# Patient Record
Sex: Male | Born: 1955 | Race: Black or African American | Hispanic: No | Marital: Single
Health system: Southern US, Community
[De-identification: ages and names within clinical notes are randomized; demographics above are authoritative.]

## PROBLEM LIST (undated history)

## (undated) DIAGNOSIS — N179 Acute kidney failure, unspecified: Secondary | ICD-10-CM

## (undated) DIAGNOSIS — J1282 Pneumonia due to coronavirus disease 2019: Secondary | ICD-10-CM

## (undated) DIAGNOSIS — J9621 Acute and chronic respiratory failure with hypoxia: Secondary | ICD-10-CM

## (undated) DIAGNOSIS — B20 Human immunodeficiency virus [HIV] disease: Secondary | ICD-10-CM

## (undated) DIAGNOSIS — U071 COVID-19: Secondary | ICD-10-CM

## (undated) DIAGNOSIS — Z21 Asymptomatic human immunodeficiency virus [HIV] infection status: Secondary | ICD-10-CM

## (undated) DIAGNOSIS — N189 Chronic kidney disease, unspecified: Secondary | ICD-10-CM

---

## 2007-03-23 ENCOUNTER — Ambulatory Visit (HOSPITAL_COMMUNITY): Admission: RE | Admit: 2007-03-23 | Discharge: 2007-03-23 | Payer: Self-pay | Admitting: Urology

## 2007-05-09 ENCOUNTER — Encounter (INDEPENDENT_AMBULATORY_CARE_PROVIDER_SITE_OTHER): Payer: Self-pay | Admitting: Urology

## 2007-05-09 ENCOUNTER — Inpatient Hospital Stay (HOSPITAL_COMMUNITY): Admission: RE | Admit: 2007-05-09 | Discharge: 2007-05-11 | Payer: Self-pay | Admitting: Urology

## 2010-06-15 ENCOUNTER — Encounter: Payer: Self-pay | Admitting: Urology

## 2010-10-07 NOTE — Op Note (Signed)
NAMEHOWIE, Connor Barr            ACCOUNT NO.:  [id number]   MEDICAL RECORD NO.:  [id number]          PATIENT TYPE:  INP   LOCATION:  1416                         FACILITY:  [facility]   PHYSICIAN:  Heloise Purpura, MD      DATE OF BIRTH:  [birth year]   DATE OF PROCEDURE:  05/09/2007  DATE OF DISCHARGE:                               OPERATIVE REPORT   PREOPERATIVE DIAGNOSIS:  Clinically localized adenocarcinoma of  prostate.   POSTOPERATIVE DIAGNOSIS:  Clinically localized adenocarcinoma of  prostate.   PROCEDURES.:  1. Robotic-assisted laparoscopic radical prostatectomy (non nerve      sparing).  2. Bilateral laparoscopic pelvic lymphadenectomy.   SURGEON:  Heloise Purpura, MD   ASSISTANT:  Dr. Judeen Hammans.   ESTIMATED BLOOD LOSS:  100 mL.   SPECIMENS:  1. Prostate and seminal vesicles.  2. Right pelvic lymph nodes.  3. Left pelvic lymph node.   DISPOSITION OF SPECIMEN:  To pathology.   DRAINS:  1. A 20-French Cuda catheter.  2. A #19 Blake pelvic drain.   INDICATION:  Mr. Esco is a 55 year old gentleman with clinically  localized adenocarcinoma of the prostate.  After discussing management  options for treatment, he elected to proceed with surgical therapy and  the above procedures.  The potential risks, complications and  alternative options were discussed with the patient in detail, and  informed consent was obtained..   DESCRIPTION OF PROCEDURE:  The patient was taken to the operating room  and a general anesthetic was administered.  He was given preoperative  antibiotics, placed in the dorsal lithotomy position, and prepped and  draped in the usual sterile fashion.  Next, a preoperative time-out was  performed.  A Foley catheter was inserted into the bladder, and a site  was selected just superior to the umbilicus for placement of the camera  port.  This was placed using a standard open Hassan technique.  This  allowed entry into the peritoneal cavity under  direct vision without  difficulty.  A 0-degree lens was used to inspect the abdomen, and there  was no evidence of any intra-abdominal injuries or other abnormalities.  The remaining ports were then placed.  Bilateral 8 mm robotic ports were  placed approximately 10 cm lateral to and just inferior to the camera  port incision.  An additional 8 mm robotic port was placed in the far  left lateral abdominal wall.  A 5 mm port was placed between the camera  port and the right robotic port.  An additional 12 mm port was placed in  the far right lateral abdominal wall for laparoscopic assistance.  All  ports were placed under direct vision and without difficulty.  The  surgical cart was then docked.  With the aid of the cautery scissors,  the bladder was reflected posteriorly, allowing entry into the space of  Retzius and identification of the endopelvic fascia and prostate.  The  endopelvic fascia was then incised from the apex back to the base of the  prostate bilaterally, and the underlying levator muscle fibers were  swept laterally off the prostate; thereby isolating the  dorsal venous  complex.  The dorsal venous complex was then stapled and divided with a  45 mm flex ETS stapler.  The bladder neck was then identified with the  aid of Foley catheter manipulation, and the bladder neck was incised  anteriorly.  This exposed the Foley catheter, and the catheter balloon  was deflated and brought into the operative field to retract the  prostate anteriorly.  The posterior bladder neck was then identified and  was similarly divided.  Dissection continued between the bladder neck  and the prostate until the vas deferens and seminal vesicles were  identified.  The vas deferens were isolated, divided and lifted  anteriorly.  The seminal vesicles were then dissected down to their tip  and lifted anteriorly.  The space between Denonvilliers' fascia and the  anterior rectum was bluntly developed, and  the vascular pedicles of the  prostate were isolated.  The vascular pedicles of the prostate were then  ligated in a wide, non-nerve sparing fashion.  The urethra was then  sharply divided, allowing the prostate specimen to be disarticulated.  The pelvis was copiously irrigated and hemostasis was ensured.  With  irrigation in the pelvis, air was injected into the rectal catheter and  there was no evidence of a rectal injury.  Attention was then turned to  the right pelvic sidewall.  The fibrofatty tissue between the external  iliac vein, confluence of the iliac vessels, hypogastric artery, and  Cooper's ligament was dissected free from the pelvic sidewall; with care  to preserve the obturator nerve.  Hem-o-lok clips were used for  lymphostasis and hemostasis.  This lymphatic packet was then passed off  for permanent pathologic analysis.  An identical procedure was then  performed on the contralateral side.   Attention was then turned to the urethral anastomosis.  A 2-0 Vicryl  stitch was placed between Denonvilliers' fascia, the posterior bladder  neck and the posterior urethra to reapproximate these structures.  A  double-armed 3-0 Monocryl suture was then used to perform a 360 degree  running tension-free anastomosis between the bladder neck and urethra.  A new 20-French Cuda catheter was inserted into the bladder and  irrigated.  There were no blood clots within the bladder and the  anastomosis appeared to be watertight.  A #19 Blake drain was then  brought out through the left robotic port and appropriately positioned  in the pelvis.  It was secured to the skin with a nylon suture.  The  surgical cart was then undocked.  The right lateral 12 mm port site was  closed with a 0 Vicryl suture, placed with the aid of the suture passer  device.  The prostate specimen was removed intact within the Endopouch  retrieval bag, via the camera port site -- was which was slightly  extended at the  fascial level.  The fascia at this site was then closed  with a running 0 Vicryl suture.  All remaining ports had been removed  under direct vision.  All port sites were then injected with 0.25%  Marcaine and reapproximated at the skin level with staples.  Sterile  dressings were applied.  The patient appeared to tolerate the procedure  well without complications.  He was able to be extubated and transferred  to the recovery unit in satisfactory condition.      Heloise Purpura, MD  Electronically Signed     LB/MEDQ  D:  05/09/2007  T:  05/10/2007  Job:  [id number]

## 2010-10-07 NOTE — H&P (Signed)
Connor Barr, Connor Barr            ACCOUNT NO.:  [id number]   MEDICAL RECORD NO.:  [id number]          PATIENT TYPE:  INP   LOCATION:  1416                         FACILITY:  [facility]   PHYSICIAN:  Heloise Purpura, MD      DATE OF BIRTH:  [birth year]   DATE OF ADMISSION:  05/09/2007  DATE OF DISCHARGE:                              HISTORY & PHYSICAL   CHIEF COMPLAINT:  Prostate cancer.   HISTORY:  Connor Barr is a 56 year old gentleman with a history of HIV, who  was found to have clinically localized prostate cancer.  He was found to  have clinical stage T1c adenocarcinoma of the prostate with a Gleason  score of 3+4=7 and PSA of 8.22.  After a discussion regarding management  options for clinically localized prostate cancer, he elected to proceed  with surgical therapy and the above procedure.  I did discuss the  situation with Dr. Charlann Noss, his infectious disease doctor, and it was  felt that his HIV was under good control and that his life span likely  would be unaffected by his HIV status.   PAST MEDICAL HISTORY:  1. HIV.  2. Hypertension.  3. Prostate cancer.   PAST SURGICAL HISTORY:  None.   MEDICATIONS:  1. Atripla  2. Folic acid.  3. Fosinopril and hydrochlorothiazide.  4. Megace.  5. Vitamin B12.   ALLERGIES:  PENICILLIN WHICH CAUSES A RASH.   FAMILY HISTORY:  There is no family history of prostate cancer or other  GU malignancy.  There is a maternal history of breast cancer.   SOCIAL HISTORY:  The patient quit smoking 3 years ago.  He denies  alcohol use.   REVIEW OF SYSTEMS:  A complete review of systems was performed.  Pertinent positives include a history of some recent weight loss.   PHYSICAL EXAM:  CONSTITUTIONAL:  Well-nourished, well-developed, age-  appropriate male in no acute distress.  CARDIOVASCULAR:  Regular rate and rhythm without obvious murmurs.  LUNGS:  Clear bilaterally.  ABDOMEN:  Soft, nontender, nondistended without abdominal masses.  PROSTATE:  No nodularity or induration.   IMPRESSION:  Clinically localized adenocarcinoma of the prostate.   PLAN:  The patient will undergo robotic-assisted laparoscopic radical  prostatectomy and then be admitted to the hospital for routine  postoperative care.      Heloise Purpura, MD  Electronically Signed     LB/MEDQ  D:  05/09/2007  T:  05/10/2007  Job:  [id number]

## 2010-10-07 NOTE — Discharge Summary (Signed)
NAMEGINO, Connor Barr            ACCOUNT NO.:  [id number]   MEDICAL RECORD NO.:  [id number]          PATIENT TYPE:  INP   LOCATION:  1416                         FACILITY:  [facility]   PHYSICIAN:  Heloise Purpura, MD      DATE OF BIRTH:  1955-08-16   DATE OF ADMISSION:  05/09/2007  DATE OF DISCHARGE:  05/11/2007                               DISCHARGE SUMMARY   ADMISSION DIAGNOSIS:  Prostate cancer.   DISCHARGE DIAGNOSIS:  Prostate cancer.   HISTORY AND PHYSICAL:  For full details please see admission history and  physical.  Briefly,  [name] is a 55 year old gentleman with clinically  localized adenocarcinoma of the prostate.  After a detailed discussion  regarding management options for treatment, he elected to proceed with  surgical therapy and a robotic prostatectomy and bilateral pelvic  lymphadenectomy.  He is known to be HIV positive and is under the care  of Dr. Charlann Noss and his HIV status is felt to be well-controlled with  a life expectancy of greater than 10 years.   HOSPITAL COURSE:  The patient was taken to the operating room on  May 09, 2007 and underwent a robotic-assisted laparoscopic radical  prostatectomy and bilateral pelvic lymphadenectomy. He tolerated this  procedure well and without complications.  Postoperatively, he was able  to be transferred to a regular hospital room following recovery from  anesthesia.  He was able to begin ambulating the night of surgery and  remained hemodynamically stable. On the morning of postoperative day #1,  his hematocrit was checked and found to be 26.1 which was stable from  his preoperatively hematocrit.  He maintained excellent urine output  from his Foley catheter with minimal output from his pelvic drain. His  pelvic drain was therefore removed.  He was able to be transitioned to a  clear liquid diet which he tolerated fairly well and was able to be  transitioned to oral pain medication.  On evaluation in the afternoon  of  postoperative day #1, the patient did have some abdominal distention and  after discussion, the patient did wish to remain hospitalized for  further observation. This was felt to be reasonable and he therefore was  kept overnight on postoperative day #2.  On the morning of postoperative  day #2, he was reevaluated and had less abdominal distention.  He  tolerated a full liquid diet was able to be discharged home in excellent  condition.   DISPOSITION:  Home.   DISCHARGE MEDICATIONS:  The patient has been instructed to resume all of  his regular home medications and was given a prescription to take  Vicodin as needed for pain and told to use Colace as a stool softener.  He was given a prescription also to take Cipro beginning 1 day prior to  his return visit for Foley catheter removal.   DISCHARGE INSTRUCTIONS:  He was instructed to be ambulatory but  specifically told to refrain from any heavy lifting, strenuous activity,  or driving.  He was instructed on routine Foley catheter care and told  to gradually advance his diet once passing flatus.  FOLLOW-UP:  He will follow-up in 1 week for Foley catheter removal and  to discuss his surgical pathology in detail.      Heloise Purpura, MD  Electronically Signed     LB/MEDQ  D:  05/11/2007  T:  05/11/2007  Job:  [id number]   cc:   Charlann Noss, MD  [place], [place]

## 2011-02-27 LAB — HEMOGLOBIN AND HEMATOCRIT, BLOOD
HCT: 26.1 — ABNORMAL LOW
HCT: 27.3 — ABNORMAL LOW
Hemoglobin: 9.1 — ABNORMAL LOW
Hemoglobin: 9.4 — ABNORMAL LOW
Hemoglobin: 9.6 — ABNORMAL LOW

## 2011-02-27 LAB — TYPE AND SCREEN: ABO/RH(D): A POS

## 2011-02-27 LAB — ABO/RH: ABO/RH(D): A POS

## 2011-03-02 LAB — COMPREHENSIVE METABOLIC PANEL
Albumin: 4.2
Alkaline Phosphatase: 40
BUN: 9
CO2: 22
Calcium: 9.7
Chloride: 108
Glucose, Bld: 113 — ABNORMAL HIGH
Sodium: 139
Total Bilirubin: 0.5
Total Protein: 7.7

## 2011-03-02 LAB — CBC
HCT: 32 — ABNORMAL LOW
MCHC: 34.8
RDW: 14.6
WBC: 5.2

## 2020-04-06 ENCOUNTER — Other Ambulatory Visit (HOSPITAL_COMMUNITY): Payer: Medicare Other

## 2020-04-06 ENCOUNTER — Inpatient Hospital Stay
Admission: EM | Admit: 2020-04-06 | Discharge: 2020-04-16 | Disposition: A | Discharge status: Skilled Nursing Facility | Payer: Medicare Other | Source: Other Acute Inpatient Hospital | Attending: Internal Medicine | Admitting: Internal Medicine

## 2020-04-06 DIAGNOSIS — U071 COVID-19: Secondary | ICD-10-CM | POA: Diagnosis present

## 2020-04-06 DIAGNOSIS — J9690 Respiratory failure, unspecified, unspecified whether with hypoxia or hypercapnia: Secondary | ICD-10-CM

## 2020-04-06 DIAGNOSIS — B20 Human immunodeficiency virus [HIV] disease: Secondary | ICD-10-CM | POA: Diagnosis present

## 2020-04-06 DIAGNOSIS — J189 Pneumonia, unspecified organism: Secondary | ICD-10-CM

## 2020-04-06 DIAGNOSIS — N179 Acute kidney failure, unspecified: Secondary | ICD-10-CM | POA: Diagnosis present

## 2020-04-06 DIAGNOSIS — N189 Chronic kidney disease, unspecified: Secondary | ICD-10-CM | POA: Diagnosis present

## 2020-04-06 DIAGNOSIS — J9621 Acute and chronic respiratory failure with hypoxia: Secondary | ICD-10-CM | POA: Diagnosis present

## 2020-04-06 HISTORY — DX: Human immunodeficiency virus (HIV) disease: B20

## 2020-04-06 HISTORY — DX: Asymptomatic human immunodeficiency virus (hiv) infection status: Z21

## 2020-04-06 HISTORY — DX: Acute kidney failure, unspecified: N17.9

## 2020-04-06 HISTORY — DX: Acute and chronic respiratory failure with hypoxia: J96.21

## 2020-04-06 HISTORY — DX: Pneumonia due to coronavirus disease 2019: J12.82

## 2020-04-06 HISTORY — DX: Acute kidney failure, unspecified: N18.9

## 2020-04-06 HISTORY — DX: COVID-19: U07.1

## 2020-04-06 LAB — CBC WITH DIFFERENTIAL/PLATELET
Abs Immature Granulocytes: 0.13 10*3/uL — ABNORMAL HIGH (ref 0.00–0.07)
Basophils Absolute: 0 10*3/uL (ref 0.0–0.1)
Basophils Relative: 1 %
Eosinophils Absolute: 0.3 10*3/uL (ref 0.0–0.5)
Eosinophils Relative: 5 %
HCT: 28.1 % — ABNORMAL LOW (ref 39.0–52.0)
Hemoglobin: 8.6 g/dL — ABNORMAL LOW (ref 13.0–17.0)
Immature Granulocytes: 2 %
Lymphocytes Relative: 12 %
Lymphs Abs: 0.8 10*3/uL (ref 0.7–4.0)
MCH: 30.3 pg (ref 26.0–34.0)
MCHC: 30.6 g/dL (ref 30.0–36.0)
MCV: 98.9 fL (ref 80.0–100.0)
Monocytes Absolute: 0.8 10*3/uL (ref 0.1–1.0)
Monocytes Relative: 11 %
Neutro Abs: 4.8 10*3/uL (ref 1.7–7.7)
Neutrophils Relative %: 69 %
Platelets: 143 10*3/uL — ABNORMAL LOW (ref 150–400)
RBC: 2.84 MIL/uL — ABNORMAL LOW (ref 4.22–5.81)
RDW: 16 % — ABNORMAL HIGH (ref 11.5–15.5)
WBC: 6.9 10*3/uL (ref 4.0–10.5)
nRBC: 0 % (ref 0.0–0.2)

## 2020-04-06 LAB — COMPREHENSIVE METABOLIC PANEL
ALT: 30 U/L (ref 0–44)
AST: 24 U/L (ref 15–41)
Albumin: 3.1 g/dL — ABNORMAL LOW (ref 3.5–5.0)
Alkaline Phosphatase: 41 U/L (ref 38–126)
Anion gap: 11 (ref 5–15)
BUN: 14 mg/dL (ref 8–23)
CO2: 20 mmol/L — ABNORMAL LOW (ref 22–32)
Calcium: 9.4 mg/dL (ref 8.9–10.3)
Chloride: 108 mmol/L (ref 98–111)
Creatinine, Ser: 2.07 mg/dL — ABNORMAL HIGH (ref 0.61–1.24)
GFR, Estimated: 35 mL/min — ABNORMAL LOW (ref >60)
Glucose, Bld: 96 mg/dL (ref 70–99)
Potassium: 3.8 mmol/L (ref 3.5–5.1)
Sodium: 139 mmol/L (ref 135–145)
Total Bilirubin: 1.3 mg/dL — ABNORMAL HIGH (ref 0.3–1.2)
Total Protein: 6.8 g/dL (ref 6.5–8.1)

## 2020-04-06 LAB — HEMOGLOBIN A1C
Hgb A1c MFr Bld: 6 % — ABNORMAL HIGH (ref 4.8–5.6)
Mean Plasma Glucose: 125.5 mg/dL

## 2020-04-07 DIAGNOSIS — N179 Acute kidney failure, unspecified: Secondary | ICD-10-CM

## 2020-04-07 DIAGNOSIS — Z21 Asymptomatic human immunodeficiency virus [HIV] infection status: Secondary | ICD-10-CM

## 2020-04-07 DIAGNOSIS — N189 Chronic kidney disease, unspecified: Secondary | ICD-10-CM

## 2020-04-07 DIAGNOSIS — U071 COVID-19: Secondary | ICD-10-CM | POA: Diagnosis not present

## 2020-04-07 DIAGNOSIS — J9621 Acute and chronic respiratory failure with hypoxia: Secondary | ICD-10-CM | POA: Diagnosis not present

## 2020-04-07 NOTE — Consult Note (Signed)
Pulmonary Critical Care Medicine [facility] [place]  PULMONARY SERVICE  Date of Service: 04/07/2020  PULMONARY CRITICAL CARE CONSULT   Connor Barr  PYP:[id number]  DOB: 1955/11/09   DOA: 04/06/2020  Referring Physician: Carron Curie, MD  HPI: Connor Barr is a 64 y.o. male seen for follow up of Acute on Chronic Respiratory Failure.  Patient has past medical history significant for hypertension hyperlipidemia diabetes HIV and gastroparesis presented to the hospital because of weakness increasing shortness of breath.  Apparently has a son who tested positive for COVID-19 and subsequently had decline in status with increased temperature up to 101.2 was feeling drowsy.  Patient was admitted received remdesivir as well as antibiotics for presumed bacterial pneumonia.  Patient also did receive steroids.  Patient was noted also to have an elevated D-dimer treated empirically with Lovenox and apixaban.  He has been compliant with his medications for the HIV  Review of Systems:  ROS performed and is unremarkable other than noted above.  Past medical history: Hypertension Hyperlipidemia Diabetes type 2 HIV Gastroparesis  Past surgical history: Hip replacement Septoplasty  Social history: Former smoker No alcohol or drug use at this time  Medications: Reviewed on Rounds  Physical Exam:  Vitals: Temperature is 97.3 pulse 100 respiratory 26 blood pressure is 143/53 saturations 96%  Ventilator Settings on 8 L Oxymizer  . General: Comfortable at this time . Eyes: Grossly normal lids, irises & conjunctiva . ENT: grossly tongue is normal . Neck: no obvious mass . Cardiovascular: S1-S2 normal no gallop or rub . Respiratory: No rhonchi no rales are noted at this time . Abdomen: Soft and nontender . Skin: no rash seen on limited exam . Musculoskeletal: not rigid . Psychiatric:unable to assess . Neurologic: no seizure no involuntary movements         Labs on  Admission:  Basic Metabolic Panel: Recent Labs  Lab 04/06/20 1537  NA 139  K 3.8  CL 108  CO2 20*  GLUCOSE 96  BUN 14  CREATININE 2.07*  CALCIUM 9.4    No results for input(s): PHART, PCO2ART, PO2ART, HCO3, O2SAT in the last 168 hours.  Liver Function Tests: Recent Labs  Lab 04/06/20 1537  AST 24  ALT 30  ALKPHOS 41  BILITOT 1.3*  PROT 6.8  ALBUMIN 3.1*   No results for input(s): LIPASE, AMYLASE in the last 168 hours. No results for input(s): AMMONIA in the last 168 hours.  CBC: Recent Labs  Lab 04/06/20 1537  WBC 6.9  NEUTROABS 4.8  HGB 8.6*  HCT 28.1*  MCV 98.9  PLT 143*    Cardiac Enzymes: No results for input(s): CKTOTAL, CKMB, CKMBINDEX, TROPONINI in the last 168 hours.  BNP (last 3 results) No results for input(s): BNP in the last 8760 hours.  ProBNP (last 3 results) No results for input(s): PROBNP in the last 8760 hours.   Radiological Exams on Admission: DG CHEST PORT 1 VIEW  Result Date: 04/06/2020 CLINICAL DATA:  COVID-19 positive, pneumonia EXAM: PORTABLE CHEST 1 VIEW COMPARISON:  05/05/2007 FINDINGS: Single frontal view of the chest demonstrates an unremarkable cardiac silhouette. Basilar predominant interstitial and ground-glass opacities are seen, consistent with given history of COVID-19. No effusion or pneumothorax. No acute bony abnormality. IMPRESSION: 1. Bibasilar interstitial and ground-glass opacities consistent with multifocal pneumonia. Pattern is typical of COVID-19. Electronically Signed   By: Sharlet Salina M.D.   On: 04/06/2020 14:57    Assessment/Plan Active Problems:   Acute on chronic respiratory failure  with hypoxia (HCC)   COVID-19 virus infection   Pneumonia due to COVID-19 virus   HIV infection (HCC)   Acute on chronic renal failure (HCC)   1. Acute on chronic respiratory failure with hypoxia we will continue with 8 L Oxymizer titrate oxygen continue secretion management pulmonary toilet. Overall patient appears to  be doing well chest x-ray also looks good with minimal changes noted in the bases 2. COVID-19 virus infection in recovery phase plan is to continue to monitor wean FiO2 as already noted. 3. COVID-19 viral pneumonia treated improving we will continue to titrate oxygen down 4. HIV continue with antiretroviral therapy 5. Acute on chronic renal failure we will continue to follow patient's renal functions  I have personally seen and evaluated the patient, evaluated laboratory and imaging results, formulated the assessment and plan and placed orders. The Patient requires high complexity decision making with multiple systems involvement.  Case was discussed on Rounds with the Respiratory Therapy Director and the Respiratory staff Time Spent  Yevonne Pax, MD [facility] Pulmonary Critical Care Medicine Sleep Medicine

## 2020-04-08 DIAGNOSIS — N179 Acute kidney failure, unspecified: Secondary | ICD-10-CM | POA: Diagnosis not present

## 2020-04-08 DIAGNOSIS — J9621 Acute and chronic respiratory failure with hypoxia: Secondary | ICD-10-CM | POA: Diagnosis not present

## 2020-04-08 DIAGNOSIS — Z21 Asymptomatic human immunodeficiency virus [HIV] infection status: Secondary | ICD-10-CM | POA: Diagnosis not present

## 2020-04-08 DIAGNOSIS — U071 COVID-19: Secondary | ICD-10-CM | POA: Diagnosis not present

## 2020-04-08 NOTE — Progress Notes (Signed)
Pulmonary Critical Care Medicine [facility] [place]   PULMONARY CRITICAL CARE SERVICE  PROGRESS NOTE  Date of Service: 04/12/2020  Connor Barr  EXH:[id number]  DOB: 08-07-1955   DOA: 04/06/2020  Referring Physician: Carron Curie, MD  HPI: Connor Barr is a 64 y.o. male seen for follow up of Acute on Chronic Respiratory Failure.  Patient is now on 3 L of oxygen good saturations are noted appears to be comfortable  Medications: Reviewed on Rounds  Physical Exam:  Vitals: Temperature 98.0 pulse 104 respiratory rate 20 blood pressure is 136/51 saturations 94%  Ventilator Settings on 3 L of O2  . General: Comfortable at this time . Eyes: Grossly normal lids, irises & conjunctiva . ENT: grossly tongue is normal . Neck: no obvious mass . Cardiovascular: S1 S2 normal no gallop . Respiratory: Clear no rales . At this time . Abdomen: soft . Skin: no rash seen on limited exam . Musculoskeletal: not rigid . Psychiatric:unable to assess . Neurologic: no seizure no involuntary movements         Lab Data:   Basic Metabolic Panel: Recent Labs  Lab 04/06/20 1537 04/09/20 0545  NA 139 139  K 3.8 4.4  CL 108 107  CO2 20* 20*  GLUCOSE 96 95  BUN 14 12  CREATININE 2.07* 1.97*  CALCIUM 9.4 9.8    ABG: No results for input(s): PHART, PCO2ART, PO2ART, HCO3, O2SAT in the last 168 hours.  Liver Function Tests: Recent Labs  Lab 04/06/20 1537  AST 24  ALT 30  ALKPHOS 41  BILITOT 1.3*  PROT 6.8  ALBUMIN 3.1*   No results for input(s): LIPASE, AMYLASE in the last 168 hours. No results for input(s): AMMONIA in the last 168 hours.  CBC: Recent Labs  Lab 04/06/20 1537 04/09/20 0545  WBC 6.9 6.3  NEUTROABS 4.8  --   HGB 8.6* 8.0*  HCT 28.1* 26.3*  MCV 98.9 100.0  PLT 143* 123*    Cardiac Enzymes: No results for input(s): CKTOTAL, CKMB, CKMBINDEX, TROPONINI in the last 168 hours.  BNP (last 3 results) No results for input(s): BNP in the  last 8760 hours.  ProBNP (last 3 results) No results for input(s): PROBNP in the last 8760 hours.  Radiological Exams: No results found.  Assessment/Plan Active Problems:   Acute on chronic respiratory failure with hypoxia (HCC)   COVID-19 virus infection   Pneumonia due to COVID-19 virus   HIV infection (HCC)   Acute on chronic renal failure (HCC)   1. Acute on chronic respiratory failure hypoxia continue with titrating oxygen as tolerated patient still with bibasilar infiltrates secondary to COVID-19 2. COVID-19 virus infection in recovery we will continue to follow 3. Pneumonia and ARDS secondary to COVID-19 slow improvement we will continue to monitor closely. 4. HIV infection on antiretrovirals asymptomatic 5. Acute on chronic renal failure following labs   I have personally seen and evaluated the patient, evaluated laboratory and imaging results, formulated the assessment and plan and placed orders. The Patient requires high complexity decision making with multiple systems involvement.  Rounds were done with the Respiratory Therapy Director and Staff therapists and discussed with nursing staff also.  Yevonne Pax, MD [facility] Pulmonary Critical Care Medicine Sleep Medicine

## 2020-04-09 DIAGNOSIS — U071 COVID-19: Secondary | ICD-10-CM | POA: Diagnosis not present

## 2020-04-09 DIAGNOSIS — J9621 Acute and chronic respiratory failure with hypoxia: Secondary | ICD-10-CM | POA: Diagnosis not present

## 2020-04-09 DIAGNOSIS — Z21 Asymptomatic human immunodeficiency virus [HIV] infection status: Secondary | ICD-10-CM | POA: Diagnosis not present

## 2020-04-09 DIAGNOSIS — N179 Acute kidney failure, unspecified: Secondary | ICD-10-CM | POA: Diagnosis not present

## 2020-04-09 LAB — BASIC METABOLIC PANEL
Anion gap: 12 (ref 5–15)
BUN: 12 mg/dL (ref 8–23)
CO2: 20 mmol/L — ABNORMAL LOW (ref 22–32)
Calcium: 9.8 mg/dL (ref 8.9–10.3)
Chloride: 107 mmol/L (ref 98–111)
Creatinine, Ser: 1.97 mg/dL — ABNORMAL HIGH (ref 0.61–1.24)
GFR, Estimated: 37 mL/min — ABNORMAL LOW (ref >60)
Glucose, Bld: 95 mg/dL (ref 70–99)
Potassium: 4.4 mmol/L (ref 3.5–5.1)
Sodium: 139 mmol/L (ref 135–145)

## 2020-04-09 LAB — CBC
HCT: 26.3 % — ABNORMAL LOW (ref 39.0–52.0)
Hemoglobin: 8 g/dL — ABNORMAL LOW (ref 13.0–17.0)
MCH: 30.4 pg (ref 26.0–34.0)
MCHC: 30.4 g/dL (ref 30.0–36.0)
MCV: 100 fL (ref 80.0–100.0)
Platelets: 123 10*3/uL — ABNORMAL LOW (ref 150–400)
RBC: 2.63 MIL/uL — ABNORMAL LOW (ref 4.22–5.81)
RDW: 17.1 % — ABNORMAL HIGH (ref 11.5–15.5)
WBC: 6.3 10*3/uL (ref 4.0–10.5)
nRBC: 0.3 % — ABNORMAL HIGH (ref 0.0–0.2)

## 2020-04-09 NOTE — Progress Notes (Signed)
Pulmonary Critical Care Medicine [facility] [place]   PULMONARY CRITICAL CARE SERVICE  PROGRESS NOTE  Date of Service: 04/12/2020  Connor Barr  [id number]:[id number]  DOB: 30-Sep-1955   DOA: 04/06/2020  Referring Physician: Carron Curie, MD  HPI: Connor Barr is a 64 y.o. male seen for follow up of Acute on Chronic Respiratory Failure.  Patient currently is on 3 L of oxygen with good saturations  Medications: Reviewed on Rounds  Physical Exam:  Vitals: Temperature is 96.9 pulse 100 respiratory rate 20 blood pressure is 149/66 saturations 95%  Ventilator Settings off the ventilator right now on 3 L  . General: Comfortable at this time . Eyes: Grossly normal lids, irises & conjunctiva . ENT: grossly tongue is normal . Neck: no obvious mass . Cardiovascular: S1 S2 normal no gallop . Respiratory: As with a few scattered rhonchi . Abdomen: soft . Skin: no rash seen on limited exam . Musculoskeletal: not rigid . Psychiatric:unable to assess . Neurologic: no seizure no involuntary movements         Lab Data:   Basic Metabolic Panel: Recent Labs  Lab 04/06/20 1537 04/09/20 0545  NA 139 139  K 3.8 4.4  CL 108 107  CO2 20* 20*  GLUCOSE 96 95  BUN 14 12  CREATININE 2.07* 1.97*  CALCIUM 9.4 9.8    ABG: No results for input(s): PHART, PCO2ART, PO2ART, HCO3, O2SAT in the last 168 hours.  Liver Function Tests: Recent Labs  Lab 04/06/20 1537  AST 24  ALT 30  ALKPHOS 41  BILITOT 1.3*  PROT 6.8  ALBUMIN 3.1*   No results for input(s): LIPASE, AMYLASE in the last 168 hours. No results for input(s): AMMONIA in the last 168 hours.  CBC: Recent Labs  Lab 04/06/20 1537 04/09/20 0545  WBC 6.9 6.3  NEUTROABS 4.8  --   HGB 8.6* 8.0*  HCT 28.1* 26.3*  MCV 98.9 100.0  PLT 143* 123*    Cardiac Enzymes: No results for input(s): CKTOTAL, CKMB, CKMBINDEX, TROPONINI in the last 168 hours.  BNP (last 3 results) No results for input(s): BNP in the  last 8760 hours.  ProBNP (last 3 results) No results for input(s): PROBNP in the last 8760 hours.  Radiological Exams: No results found.  Assessment/Plan Active Problems:   Acute on chronic respiratory failure with hypoxia (HCC)   COVID-19 virus infection   Pneumonia due to COVID-19 virus   HIV infection (HCC)   Acute on chronic renal failure (HCC)   1. Acute on chronic respiratory failure with hypoxia we will continue to titrate the oxygen down continue secretion management and supportive care 2. COVID-19 virus infection in recovery we will continue to follow 3. ARDS secondary to COVID-19 treated with COVID-19 pneumonia 4. HIV infection asymptomatic 5. Acute on chronic renal failure following labs   I have personally seen and evaluated the patient, evaluated laboratory and imaging results, formulated the assessment and plan and placed orders. The Patient requires high complexity decision making with multiple systems involvement.  Rounds were done with the Respiratory Therapy Director and Staff therapists and discussed with nursing staff also.  Yevonne Pax, MD [facility] Pulmonary Critical Care Medicine Sleep Medicine

## 2020-04-10 DIAGNOSIS — J1282 Pneumonia due to coronavirus disease 2019: Secondary | ICD-10-CM | POA: Diagnosis not present

## 2020-04-10 DIAGNOSIS — J80 Acute respiratory distress syndrome: Secondary | ICD-10-CM | POA: Diagnosis not present

## 2020-04-10 DIAGNOSIS — U071 COVID-19: Secondary | ICD-10-CM

## 2020-04-10 DIAGNOSIS — J9621 Acute and chronic respiratory failure with hypoxia: Secondary | ICD-10-CM

## 2020-04-10 NOTE — Progress Notes (Signed)
Pulmonary Critical Care Medicine [facility] [place]   PULMONARY CRITICAL CARE SERVICE  PROGRESS NOTE  Date of Service: 04/10/2020  Connor Barr  [id number]:[id number]  DOB: [birth year]   DOA: 04/06/2020  Referring Physician: Carron Curie, MD  HPI: Connor Barr is a 64 y.o. male seen for follow up of Acute on Chronic Respiratory Failure.  She remains on 3 L of oxygen good saturations are noted.  Secretions have been minimal right now  Medications: Reviewed on Rounds  Physical Exam:  Vitals: Temperature is 97.9 pulse 84 respiratory rate 18 blood pressure is 109/60 saturations 97%  Ventilator Settings on 3 L of oxygen  . General: Comfortable at this time . Eyes: Grossly normal lids, irises & conjunctiva . ENT: grossly tongue is normal . Neck: no obvious mass . Cardiovascular: S1 S2 normal no gallop . Respiratory: Very coarse breath sounds . Abdomen: soft . Skin: no rash seen on limited exam . Musculoskeletal: not rigid . Psychiatric:unable to assess . Neurologic: no seizure no involuntary movements         Lab Data:   Basic Metabolic Panel: Recent Labs  Lab 04/06/20 1537 04/09/20 0545  NA 139 139  K 3.8 4.4  CL 108 107  CO2 20* 20*  GLUCOSE 96 95  BUN 14 12  CREATININE 2.07* 1.97*  CALCIUM 9.4 9.8    ABG: No results for input(s): PHART, PCO2ART, PO2ART, HCO3, O2SAT in the last 168 hours.  Liver Function Tests: Recent Labs  Lab 04/06/20 1537  AST 24  ALT 30  ALKPHOS 41  BILITOT 1.3*  PROT 6.8  ALBUMIN 3.1*   No results for input(s): LIPASE, AMYLASE in the last 168 hours. No results for input(s): AMMONIA in the last 168 hours.  CBC: Recent Labs  Lab 04/06/20 1537 04/09/20 0545  WBC 6.9 6.3  NEUTROABS 4.8  --   HGB 8.6* 8.0*  HCT 28.1* 26.3*  MCV 98.9 100.0  PLT 143* 123*    Cardiac Enzymes: No results for input(s): CKTOTAL, CKMB, CKMBINDEX, TROPONINI in the last 168 hours.  BNP (last 3 results) No results for input(s):  BNP in the last 8760 hours.  ProBNP (last 3 results) No results for input(s): PROBNP in the last 8760 hours.  Radiological Exams: No results found.  Assessment/Plan Active Problems:   * No active hospital problems. *   1. Acute on chronic respiratory failure with hypoxia oxygen is at 3 L right now appears to be tolerating it 9 2. COVID-19 virus infection in recovery we'll continue with supportive team slow improvement 3. ARDS secondary to COVID-19 treated slow improvement   I have personally seen and evaluated the patient, evaluated laboratory and imaging results, formulated the assessment and plan and placed orders. The Patient requires high complexity decision making with multiple systems involvement.  Rounds were done with the Respiratory Therapy Director and Staff therapists and discussed with nursing staff also.  Yevonne Pax, MD [facility] Pulmonary Critical Care Medicine Sleep Medicine

## 2020-04-11 DIAGNOSIS — N179 Acute kidney failure, unspecified: Secondary | ICD-10-CM | POA: Diagnosis not present

## 2020-04-11 DIAGNOSIS — J9621 Acute and chronic respiratory failure with hypoxia: Secondary | ICD-10-CM | POA: Diagnosis not present

## 2020-04-11 DIAGNOSIS — Z21 Asymptomatic human immunodeficiency virus [HIV] infection status: Secondary | ICD-10-CM | POA: Diagnosis not present

## 2020-04-11 DIAGNOSIS — U071 COVID-19: Secondary | ICD-10-CM | POA: Diagnosis not present

## 2020-04-11 NOTE — Progress Notes (Signed)
Pulmonary Critical Care Medicine [facility] [place]   PULMONARY CRITICAL CARE SERVICE  PROGRESS NOTE  Date of Service: 04/12/2020  Connor Barr  EZM:[id number]  DOB: 06/15/1955   DOA: 04/06/2020  Referring Physician: Carron Curie, MD  HPI: Connor Barr is a 64 y.o. male seen for follow up of Acute on Chronic Respiratory Failure.  Patient is on 3 L nasal cannula doing fairly well now  Medications: Reviewed on Rounds  Physical Exam:  Vitals: Temperature is 98.4 pulse 91 respiratory rate 23 blood pressure is 119/56 saturations 96%  Ventilator Settings on 3 L of oxygen  . General: Comfortable at this time . Eyes: Grossly normal lids, irises & conjunctiva . ENT: grossly tongue is normal . Neck: no obvious mass . Cardiovascular: S1 S2 normal no gallop . Respiratory: No rhonchi no rales noted at this time . Abdomen: soft . Skin: no rash seen on limited exam . Musculoskeletal: not rigid . Psychiatric:unable to assess . Neurologic: no seizure no involuntary movements         Lab Data:   Basic Metabolic Panel: Recent Labs  Lab 04/06/20 1537 04/09/20 0545  NA 139 139  K 3.8 4.4  CL 108 107  CO2 20* 20*  GLUCOSE 96 95  BUN 14 12  CREATININE 2.07* 1.97*  CALCIUM 9.4 9.8    ABG: No results for input(s): PHART, PCO2ART, PO2ART, HCO3, O2SAT in the last 168 hours.  Liver Function Tests: Recent Labs  Lab 04/06/20 1537  AST 24  ALT 30  ALKPHOS 41  BILITOT 1.3*  PROT 6.8  ALBUMIN 3.1*   No results for input(s): LIPASE, AMYLASE in the last 168 hours. No results for input(s): AMMONIA in the last 168 hours.  CBC: Recent Labs  Lab 04/06/20 1537 04/09/20 0545  WBC 6.9 6.3  NEUTROABS 4.8  --   HGB 8.6* 8.0*  HCT 28.1* 26.3*  MCV 98.9 100.0  PLT 143* 123*    Cardiac Enzymes: No results for input(s): CKTOTAL, CKMB, CKMBINDEX, TROPONINI in the last 168 hours.  BNP (last 3 results) No results for input(s): BNP in the last 8760  hours.  ProBNP (last 3 results) No results for input(s): PROBNP in the last 8760 hours.  Radiological Exams: No results found.  Assessment/Plan Active Problems:   Acute on chronic respiratory failure with hypoxia (HCC)   COVID-19 virus infection   Pneumonia due to COVID-19 virus   HIV infection (HCC)   Acute on chronic renal failure (HCC)   1. Acute on chronic respiratory failure hypoxia we will continue with oxygen therapy doing better titrating down 2. COVID-19 virus infection in recovery we will continue with supportive care 3. ARDS due to COVID-19 treated slow improvement with COVID-19 pneumonia 4. HIV infection asymptomatic 5. Acute on chronic renal failure we will continue with supportive care   I have personally seen and evaluated the patient, evaluated laboratory and imaging results, formulated the assessment and plan and placed orders. The Patient requires high complexity decision making with multiple systems involvement.  Rounds were done with the Respiratory Therapy Director and Staff therapists and discussed with nursing staff also.  Yevonne Pax, MD [facility] Pulmonary Critical Care Medicine Sleep Medicine

## 2020-04-12 ENCOUNTER — Encounter: Payer: Self-pay | Admitting: Internal Medicine

## 2020-04-12 DIAGNOSIS — N179 Acute kidney failure, unspecified: Secondary | ICD-10-CM | POA: Diagnosis present

## 2020-04-12 DIAGNOSIS — J9621 Acute and chronic respiratory failure with hypoxia: Secondary | ICD-10-CM | POA: Diagnosis not present

## 2020-04-12 DIAGNOSIS — Z21 Asymptomatic human immunodeficiency virus [HIV] infection status: Secondary | ICD-10-CM | POA: Diagnosis present

## 2020-04-12 DIAGNOSIS — U071 COVID-19: Secondary | ICD-10-CM | POA: Diagnosis not present

## 2020-04-12 DIAGNOSIS — B20 Human immunodeficiency virus [HIV] disease: Secondary | ICD-10-CM | POA: Diagnosis present

## 2020-04-12 NOTE — Progress Notes (Signed)
Pulmonary Critical Care Medicine [facility] [place]   PULMONARY CRITICAL CARE SERVICE  PROGRESS NOTE  Date of Service: 04/12/2020  Connor Barr  [id number]  DOB: [birth year]   DOA: 04/06/2020  Referring Physician: Carron Curie, MD  HPI: Connor Barr is a 64 y.o. male seen for follow up of Acute on Chronic Respiratory Failure.  Patient is on 2 L oxygen looks good comfortable right now  Medications: Reviewed on Rounds  Physical Exam:  Vitals: Temperature 99.6 pulse 95 respiratory rate 18 blood pressure is 126/63 saturations 96%  Ventilator Settings on 2 L of O2  . General: Comfortable at this time . Eyes: Grossly normal lids, irises & conjunctiva . ENT: grossly tongue is normal . Neck: no obvious mass . Cardiovascular: S1 S2 normal no gallop . Respiratory: No rhonchi no rales are noted at this time . Abdomen: soft . Skin: no rash seen on limited exam . Musculoskeletal: not rigid . Psychiatric:unable to assess . Neurologic: no seizure no involuntary movements         Lab Data:   Basic Metabolic Panel: Recent Labs  Lab 04/06/20 1537 04/09/20 0545  NA 139 139  K 3.8 4.4  CL 108 107  CO2 20* 20*  GLUCOSE 96 95  BUN 14 12  CREATININE 2.07* 1.97*  CALCIUM 9.4 9.8    ABG: No results for input(s): PHART, PCO2ART, PO2ART, HCO3, O2SAT in the last 168 hours.  Liver Function Tests: Recent Labs  Lab 04/06/20 1537  AST 24  ALT 30  ALKPHOS 41  BILITOT 1.3*  PROT 6.8  ALBUMIN 3.1*   No results for input(s): LIPASE, AMYLASE in the last 168 hours. No results for input(s): AMMONIA in the last 168 hours.  CBC: Recent Labs  Lab 04/06/20 1537 04/09/20 0545  WBC 6.9 6.3  NEUTROABS 4.8  --   HGB 8.6* 8.0*  HCT 28.1* 26.3*  MCV 98.9 100.0  PLT 143* 123*    Cardiac Enzymes: No results for input(s): CKTOTAL, CKMB, CKMBINDEX, TROPONINI in the last 168 hours.  BNP (last 3 results) No results for input(s): BNP in the last 8760  hours.  ProBNP (last 3 results) No results for input(s): PROBNP in the last 8760 hours.  Radiological Exams: No results found.  Assessment/Plan Active Problems:   Acute on chronic respiratory failure with hypoxia (HCC)   COVID-19 virus infection   Pneumonia due to COVID-19 virus   HIV infection (HCC)   Acute on chronic renal failure (HCC)   1. Acute on chronic respiratory failure hypoxia patient right now is on 2 L minimal O2 support 2. COVID-19 virus infection in recovery phase we will continue to follow 3. ARDS treated and resolved 4. HIV infection asymptomatic on antiretrovirals 5. Acute on chronic renal failure supportive care   I have personally seen and evaluated the patient, evaluated laboratory and imaging results, formulated the assessment and plan and placed orders. The Patient requires high complexity decision making with multiple systems involvement.  Rounds were done with the Respiratory Therapy Director and Staff therapists and discussed with nursing staff also.  Yevonne Pax, MD [facility] Pulmonary Critical Care Medicine Sleep Medicine

## 2020-04-14 ENCOUNTER — Encounter (HOSPITAL_BASED_OUTPATIENT_CLINIC_OR_DEPARTMENT_OTHER): Payer: Medicare Other

## 2020-04-14 ENCOUNTER — Other Ambulatory Visit (HOSPITAL_COMMUNITY): Payer: Medicare Other

## 2020-04-14 DIAGNOSIS — J1282 Pneumonia due to coronavirus disease 2019: Secondary | ICD-10-CM

## 2020-04-14 DIAGNOSIS — U071 COVID-19: Secondary | ICD-10-CM

## 2020-04-14 DIAGNOSIS — N189 Chronic kidney disease, unspecified: Secondary | ICD-10-CM

## 2020-04-14 DIAGNOSIS — J9621 Acute and chronic respiratory failure with hypoxia: Secondary | ICD-10-CM | POA: Diagnosis not present

## 2020-04-14 DIAGNOSIS — J80 Acute respiratory distress syndrome: Secondary | ICD-10-CM

## 2020-04-14 DIAGNOSIS — N179 Acute kidney failure, unspecified: Secondary | ICD-10-CM

## 2020-04-14 DIAGNOSIS — Z21 Asymptomatic human immunodeficiency virus [HIV] infection status: Secondary | ICD-10-CM

## 2020-04-14 LAB — CBC
HCT: 20.7 % — ABNORMAL LOW (ref 39.0–52.0)
Hemoglobin: 6.4 g/dL — CL (ref 13.0–17.0)
MCH: 31.5 pg (ref 26.0–34.0)
MCHC: 30.9 g/dL (ref 30.0–36.0)
MCV: 102 fL — ABNORMAL HIGH (ref 80.0–100.0)
Platelets: 176 10*3/uL (ref 150–400)
RBC: 2.03 MIL/uL — ABNORMAL LOW (ref 4.22–5.81)
RDW: 20.4 % — ABNORMAL HIGH (ref 11.5–15.5)
WBC: 8.9 10*3/uL (ref 4.0–10.5)
nRBC: 5 % — ABNORMAL HIGH (ref 0.0–0.2)

## 2020-04-14 LAB — BASIC METABOLIC PANEL
Anion gap: 9 (ref 5–15)
BUN: 12 mg/dL (ref 8–23)
CO2: 23 mmol/L (ref 22–32)
Calcium: 9.4 mg/dL (ref 8.9–10.3)
Chloride: 107 mmol/L (ref 98–111)
Creatinine, Ser: 1.49 mg/dL — ABNORMAL HIGH (ref 0.61–1.24)
GFR, Estimated: 52 mL/min — ABNORMAL LOW (ref >60)
Glucose, Bld: 133 mg/dL — ABNORMAL HIGH (ref 70–99)
Potassium: 3.8 mmol/L (ref 3.5–5.1)
Sodium: 139 mmol/L (ref 135–145)

## 2020-04-14 LAB — OCCULT BLOOD X 1 CARD TO LAB, STOOL: Fecal Occult Bld: NEGATIVE

## 2020-04-14 LAB — PREPARE RBC (CROSSMATCH)

## 2020-04-14 NOTE — Progress Notes (Signed)
VASCULAR LAB    Bilateral lower extremity venous duplex has been performed.  See CV proc for preliminary results.   Jiraiya Mcewan, RVT 04/14/2020, 6:31 PM

## 2020-04-15 LAB — BPAM RBC
Blood Product Expiration Date: 202112202359
ISSUE DATE / TIME: 202111211241
Unit Type and Rh: 6200

## 2020-04-15 LAB — TYPE AND SCREEN
ABO/RH(D): A POS
Antibody Screen: NEGATIVE
Unit division: 0

## 2020-04-15 LAB — CBC
HCT: 24 % — ABNORMAL LOW (ref 39.0–52.0)
Hemoglobin: 7.7 g/dL — ABNORMAL LOW (ref 13.0–17.0)
MCH: 30.8 pg (ref 26.0–34.0)
MCHC: 32.1 g/dL (ref 30.0–36.0)
MCV: 96 fL (ref 80.0–100.0)
Platelets: 187 10*3/uL (ref 150–400)
RBC: 2.5 MIL/uL — ABNORMAL LOW (ref 4.22–5.81)
RDW: 22 % — ABNORMAL HIGH (ref 11.5–15.5)
WBC: 8.5 10*3/uL (ref 4.0–10.5)
nRBC: 10.7 % — ABNORMAL HIGH (ref 0.0–0.2)

## 2020-04-15 LAB — PATHOLOGIST SMEAR REVIEW

## 2020-04-16 LAB — HEMOGLOBIN AND HEMATOCRIT, BLOOD
HCT: 24.1 % — ABNORMAL LOW (ref 39.0–52.0)
Hemoglobin: 7.5 g/dL — ABNORMAL LOW (ref 13.0–17.0)

## 2022-07-25 IMAGING — DX DG CHEST 1V PORT
1 series · 1 of 1 positions shown · non-contrast
Comparison: 05/05/2007

CLINICAL DATA: XIUXS-TP positive, pneumonia

EXAM:
PORTABLE CHEST 1 VIEW

[chest]
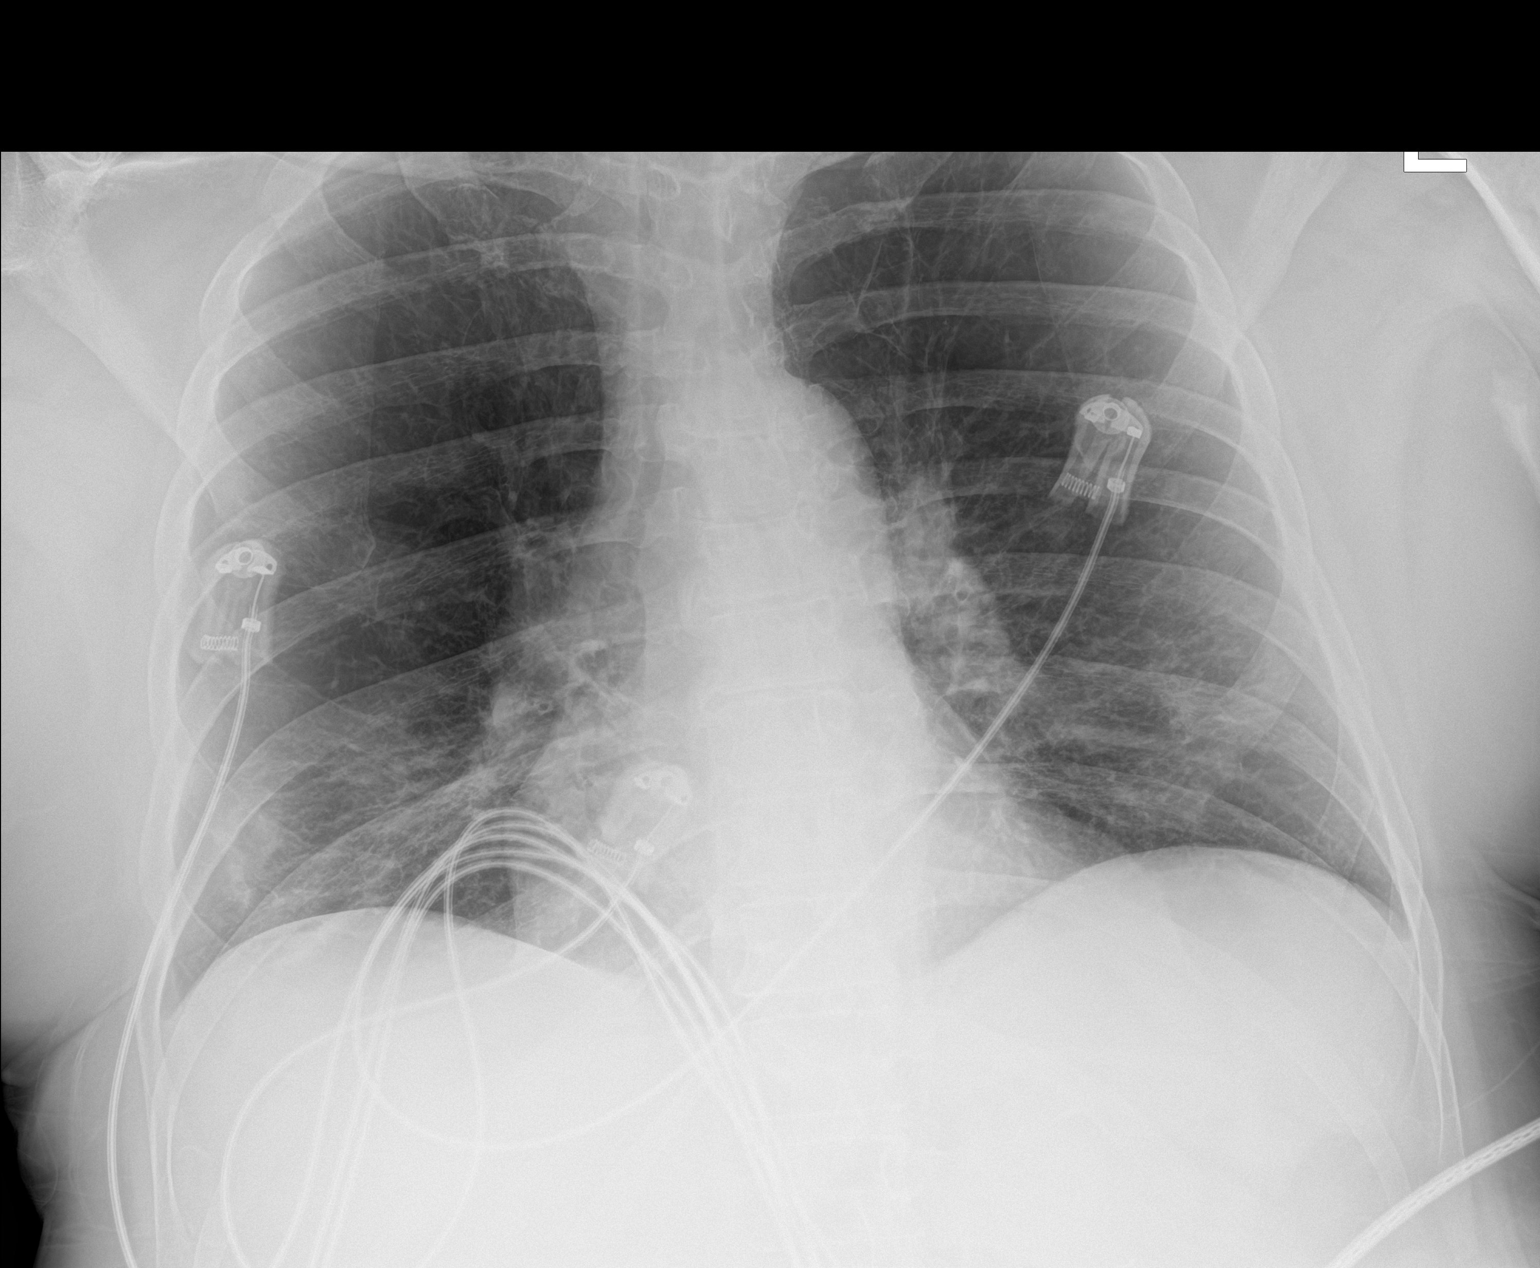

[1 of 1 positions shown; findings below may reference images not displayed]

FINDINGS: Single frontal view of the chest demonstrates an unremarkable
cardiac silhouette. Basilar predominant interstitial and
ground-glass opacities are seen, consistent with given history of
XIUXS-TP. No effusion or pneumothorax. No acute bony abnormality.
IMPRESSION: 1. Bibasilar interstitial and ground-glass opacities consistent with
multifocal pneumonia. Pattern is typical of XIUXS-TP.
